# Patient Record
Sex: Male | Born: 2005 | Race: Black or African American | Hispanic: No | Marital: Single | Smoking: Never smoker
Health system: Southern US, Community
[De-identification: ages and names within clinical notes are randomized; demographics above are authoritative.]

---
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
## 2024-02-26 DIAGNOSIS — S8001XA Contusion of right knee, initial encounter: Secondary | ICD-10-CM | POA: Diagnosis not present

## 2024-02-27 DIAGNOSIS — M25561 Pain in right knee: Secondary | ICD-10-CM | POA: Diagnosis not present

## 2024-03-05 DIAGNOSIS — M25561 Pain in right knee: Secondary | ICD-10-CM | POA: Diagnosis not present
